# Patient Record
Sex: Female | Born: 1950 | Race: Black or African American | Hispanic: No | Marital: Single | Smoking: Never smoker
Health system: Southern US, Community
[De-identification: ages and names within clinical notes are randomized; demographics above are authoritative.]

## PROBLEM LIST (undated history)

## (undated) DIAGNOSIS — I10 Essential (primary) hypertension: Secondary | ICD-10-CM

## (undated) DIAGNOSIS — F329 Major depressive disorder, single episode, unspecified: Secondary | ICD-10-CM

## (undated) DIAGNOSIS — F32A Depression, unspecified: Secondary | ICD-10-CM

## (undated) HISTORY — PX: BACK SURGERY: SHX140

## (undated) HISTORY — PX: ABDOMINAL HYSTERECTOMY: SHX81

## (undated) HISTORY — PX: KNEE SURGERY: SHX244

---

## 2015-05-31 ENCOUNTER — Encounter (HOSPITAL_COMMUNITY): Payer: Self-pay | Admitting: Emergency Medicine

## 2015-05-31 ENCOUNTER — Emergency Department (HOSPITAL_COMMUNITY): Payer: 59

## 2015-05-31 ENCOUNTER — Emergency Department (HOSPITAL_COMMUNITY)
Admission: EM | Admit: 2015-05-31 | Discharge: 2015-06-01 | Disposition: A | Discharge status: Home / Self Care | Payer: 59 | Attending: Emergency Medicine | Admitting: Emergency Medicine

## 2015-05-31 DIAGNOSIS — S066X0A Traumatic subarachnoid hemorrhage without loss of consciousness, initial encounter: Secondary | ICD-10-CM | POA: Insufficient documentation

## 2015-05-31 DIAGNOSIS — I10 Essential (primary) hypertension: Secondary | ICD-10-CM | POA: Diagnosis not present

## 2015-05-31 DIAGNOSIS — S069X0A Unspecified intracranial injury without loss of consciousness, initial encounter: Secondary | ICD-10-CM

## 2015-05-31 DIAGNOSIS — I609 Nontraumatic subarachnoid hemorrhage, unspecified: Secondary | ICD-10-CM

## 2015-05-31 DIAGNOSIS — S0003XA Contusion of scalp, initial encounter: Secondary | ICD-10-CM | POA: Insufficient documentation

## 2015-05-31 DIAGNOSIS — W01198A Fall on same level from slipping, tripping and stumbling with subsequent striking against other object, initial encounter: Secondary | ICD-10-CM | POA: Diagnosis not present

## 2015-05-31 DIAGNOSIS — Y9289 Other specified places as the place of occurrence of the external cause: Secondary | ICD-10-CM | POA: Diagnosis not present

## 2015-05-31 DIAGNOSIS — Y9389 Activity, other specified: Secondary | ICD-10-CM | POA: Diagnosis not present

## 2015-05-31 DIAGNOSIS — S0990XA Unspecified injury of head, initial encounter: Secondary | ICD-10-CM | POA: Diagnosis present

## 2015-05-31 DIAGNOSIS — Y998 Other external cause status: Secondary | ICD-10-CM | POA: Diagnosis not present

## 2015-05-31 DIAGNOSIS — Z8659 Personal history of other mental and behavioral disorders: Secondary | ICD-10-CM | POA: Insufficient documentation

## 2015-05-31 HISTORY — DX: Major depressive disorder, single episode, unspecified: F32.9

## 2015-05-31 HISTORY — DX: Essential (primary) hypertension: I10

## 2015-05-31 HISTORY — DX: Depression, unspecified: F32.A

## 2015-05-31 MED ORDER — HYDROCODONE-ACETAMINOPHEN 5-325 MG PO TABS: 2.0000 | ORAL_TABLET | ORAL | Status: AC | PRN

## 2015-05-31 MED ORDER — FENTANYL CITRATE (PF) 100 MCG/2ML IJ SOLN: 50.0000 ug | INTRAMUSCULAR | Status: DC | PRN

## 2015-05-31 MED ORDER — ACETAMINOPHEN 325 MG PO TABS
650.0000 mg | ORAL_TABLET | Freq: Once | ORAL | Status: AC
  Administered 2015-05-31: 650 mg via ORAL
  Filled 2015-05-31: qty 2

## 2015-05-31 NOTE — ED Provider Notes (Signed)
CSN: [id number]     Arrival date & time 05/31/15  2031 History   First MD Initiated Contact with Patient 05/31/15 2152     Chief Complaint  Patient presents with  . Fall  . Head Injury     (Consider location/radiation/quality/duration/timing/severity/associated sxs/prior Treatment) HPI  The patient is a 64 year old female, she reports a history of hypertension and depression, according to family members the patient was rolling his evening, she let her foot slip over the line at which time she slipped on the slick surface, falling onto her bottom and then backwards onto the back of her scalp. She did strike her head hard, there was no loss of consciousness, the patient does not remember this incident, she does not how she got to the hospital. She denies being on blood thinners, the family corroborates this. She is from out of town, home [place]. The patient denies any nausea or seizures  Past Medical History  Diagnosis Date  . Hypertension   . Depression    Past Surgical History  Procedure Laterality Date  . Knee surgery Right   . Abdominal hysterectomy     No family history on file. Social History  Substance Use Topics  . Smoking status: Never Smoker   . Smokeless tobacco: None  . Alcohol Use: Yes     Comment: occasionally   OB History    No data available     Review of Systems  All other systems reviewed and are negative.     Allergies  Review of patient's allergies indicates no known allergies.  Home Medications   Prior to Admission medications   Medication Sig Start Date End Date Taking? Authorizing Provider  HYDROcodone-acetaminophen (NORCO/VICODIN) 5-325 MG tablet Take 2 tablets by mouth every 4 (four) hours as needed. 05/31/15   Eber HongBrian Kyliee Ortego, MD   BP 140/90 mmHg  Pulse 74  Temp(Src) 98.9 F (37.2 C) (Oral)  Resp 29  Ht 5\' 4"  (1.626 m)  Wt 265 lb (120.203 kg)  BMI 45.46 kg/m2  SpO2 100% Physical Exam  Constitutional: She appears well-developed  and well-nourished. No distress.  HENT:  Head: Normocephalic.  Mouth/Throat: Oropharynx is clear and moist. No oropharyngeal exudate.  Hematoma to posterior scalp  Eyes: Conjunctivae and EOM are normal. Pupils are equal, round, and reactive to light. Right eye exhibits no discharge. Left eye exhibits no discharge. No scleral icterus.  Neck: Normal range of motion. Neck supple. No JVD present. No thyromegaly present.  Cardiovascular: Normal rate, regular rhythm, normal heart sounds and intact distal pulses.  Exam reveals no gallop and no friction rub.   No murmur heard. Pulmonary/Chest: Effort normal and breath sounds normal. No respiratory distress. She has no wheezes. She has no rales.  Abdominal: Soft. Bowel sounds are normal. She exhibits no distension and no mass. There is no tenderness.  Musculoskeletal: Normal range of motion. She exhibits no edema or tenderness.  Supple joints, soft compartments diffusely  Lymphadenopathy:    She has no cervical adenopathy.  Neurological: She is alert. Coordination normal.  Normal strength and sensation in all 4 extremities, normal speech, normal coordination by finger-nose-finger, memory loss of the events of the evening  Skin: Skin is warm and dry. No rash noted. No erythema.  Psychiatric: She has a normal mood and affect. Her behavior is normal.  Nursing note and vitals reviewed.   ED Course  Procedures (including critical care time) Labs Review Labs Reviewed - No data to display  Imaging Review Ct Head  Wo Contrast  05/31/2015  CLINICAL DATA:  64 year old female with hypertension presenting with fall and trauma to the head. EXAM: CT HEAD WITHOUT CONTRAST TECHNIQUE: Contiguous axial images were obtained from the base of the skull through the vertex without intravenous contrast. COMPARISON:  None. FINDINGS: There is a small curvilinear high attenuation along the left posterior frontal convexity most compatible with subarachnoid hemorrhage. No  other intracranial hemorrhage identified. There is no mass effect or midline shift. The ventricles and the sulci are appropriate in size for the patient's age. The gray-white matter differentiation is preserved. There is partial opacification of the visualized maxillary sinuses as well as partial opacification of the sphenoid sinuses. The visualized mastoid air cells are clear. The calvarium is intact. Small scalp hematoma noted over the posterior vertex. IMPRESSION: Small left posterior frontal convexity subarachnoid hemorrhage. Follow-up recommended. Critical Value/emergent results were called by telephone at the time of interpretation on 05/31/2015 at 9:49 pm to Dr. Rhunette Croft , who verbally acknowledged these results. Electronically Signed   By: Elgie Collard M.D.   On: 05/31/2015 21:53   Ct Cervical Spine Wo Contrast  05/31/2015  CLINICAL DATA:  [place] EXAM: CT CERVICAL SPINE WITHOUT CONTRAST TECHNIQUE: Multidetector CT imaging of the cervical spine was performed without intravenous contrast. Multiplanar CT image reconstructions were also generated. COMPARISON:  None. FINDINGS: The alignment of the cervical spine appears normal. The facet joints appear well-aligned. The vertebral body heights and disc spaces are well preserved. No fracture or subluxation identified. Mild multi level disc space narrowing and ventral endplate spurring noted. IMPRESSION: 1. No evidence for cervical spine fracture. 2. Mild degenerative disc disease. Electronically Signed   By: Signa Kell M.D.   On: 05/31/2015 23:34   I have personally reviewed and evaluated these images and lab results as part of my medical decision-making.    MDM   Final diagnoses:  SAH (subarachnoid hemorrhage) (HCC)  TBI (traumatic brain injury), without loss of consciousness, initial encounter (HCC)    The patient does have some posterior C-spine tenderness, she was placed in cervical immobilization, go back for CT C-spine.  Subarachnoid hemorrhage is seen on the CT scan of the brain without contrast. Neurosurgery was paged, this does look like a small amount of traumatic subarachnoid hemorrhage.   D/w Dr. Bevely Palmer who states pt can go home with family - I agree, she is well appearing.    Pt informed of results - well appearing, no CT C spine findings of concern.  Discussed precautions with the patient and family members at length including avoiding anti-inflammatories and worsening neurologic function for which she should return immediately, understanding expressed.  Given copy of results / tylenol and note for airlines.  Meds given in ED:  Medications  fentaNYL (SUBLIMAZE) injection 50 mcg (not administered)    New Prescriptions   HYDROCODONE-ACETAMINOPHEN (NORCO/VICODIN) 5-325 MG TABLET    Take 2 tablets by mouth every 4 (four) hours as needed.      Eber Hong, MD 05/31/15 [phone]

## 2015-05-31 NOTE — Discharge Instructions (Signed)
Avoid NSAID medicines like aleve, aspirin or ibuprofen  Tylenol for pain  Hydrocodone for Severe pain  Call your doctor in the morning to set up a follow up appointment for 1 week from today

## 2015-05-31 NOTE — ED Notes (Signed)
[place] No loc according to family but pt has had trouble remembering incident. She is not on blood thinners. Pt is asking repetitive questions.

## 2015-05-31 NOTE — ED Notes (Addendum)
Pt placed in C-collar per Dr. Hyacinth MeekerMiller order, placed in gown, pulse ox, bp cuff and 5 lead. Family bedside.

## 2016-07-26 IMAGING — CT CT CERVICAL SPINE W/O CM
3 series · 13 of 33 positions shown, 16 images · non-contrast
Comparison: None.

CLINICAL DATA: Fell at [employer].

EXAM:
CT CERVICAL SPINE WITHOUT CONTRAST
TECHNIQUE: Multidetector CT imaging of the cervical spine was performed without
intravenous contrast. Multiplanar CT image reconstructions were also
generated.

[Series 205: coronals · coronal · 0.39mm/px · 3 of 38 slices shown]
[im 8/38  bone]
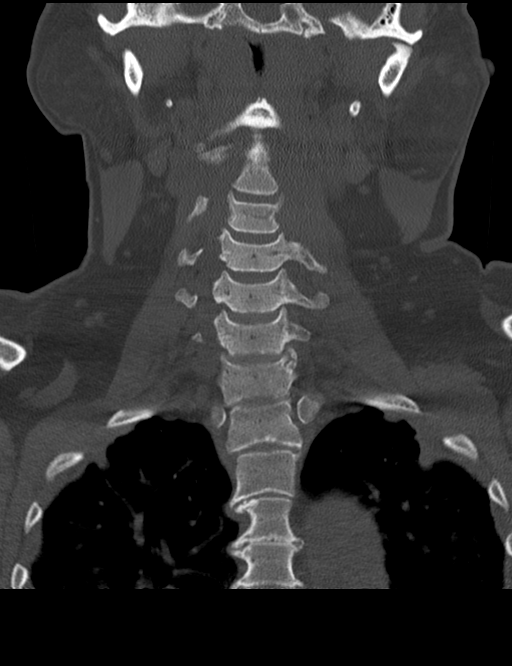
[im 15/38  bone]
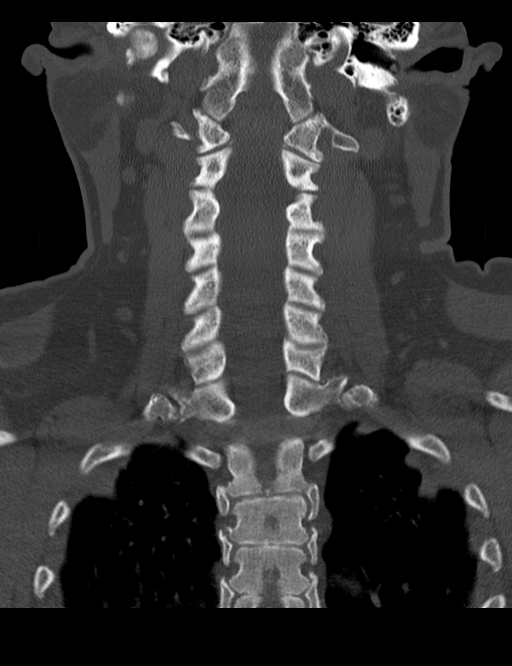
[im 23/38  bone]
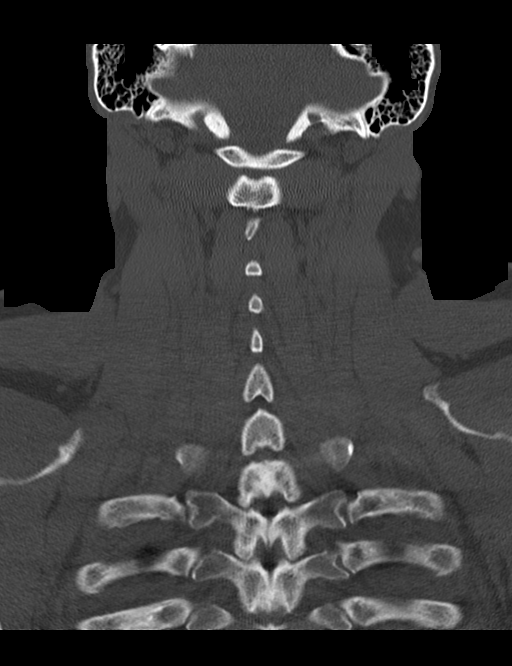

[Series 206: sagittals · sagittal · 0.39mm/px · 5 of 47 slices shown, 6 images]
[im 16/47  bone]
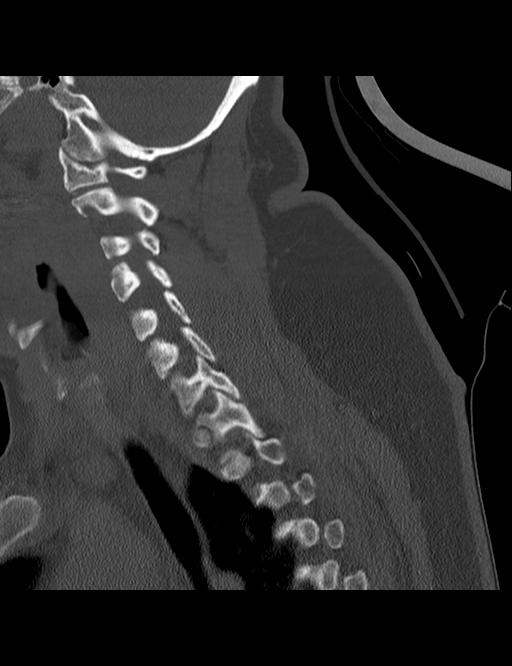
[im 20/47  bone]
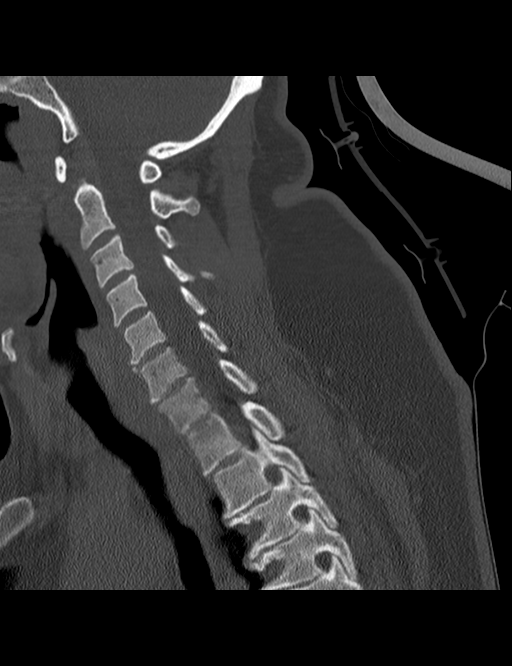
[im 24/47  soft-tissue]
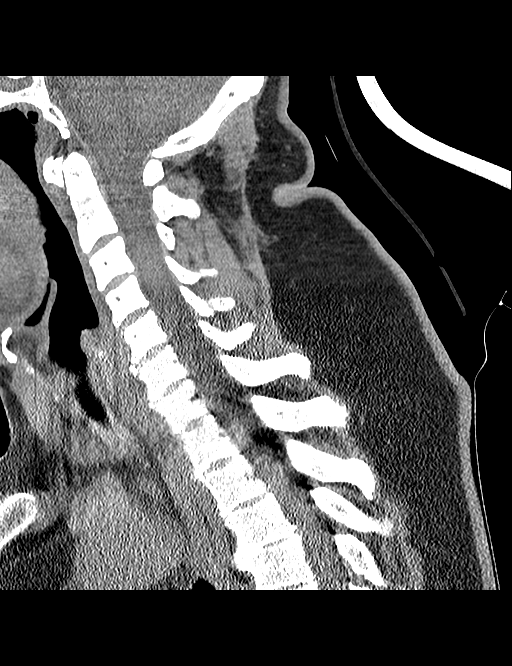
[im 24/47  bone]
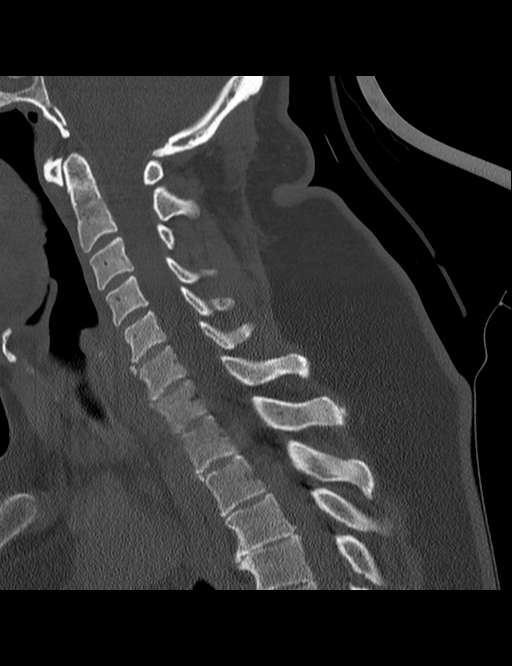
[im 27/47  bone]
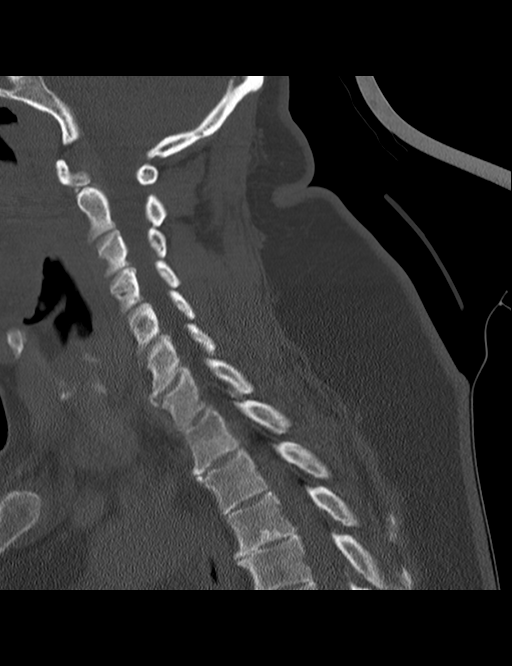
[im 31/47  bone]
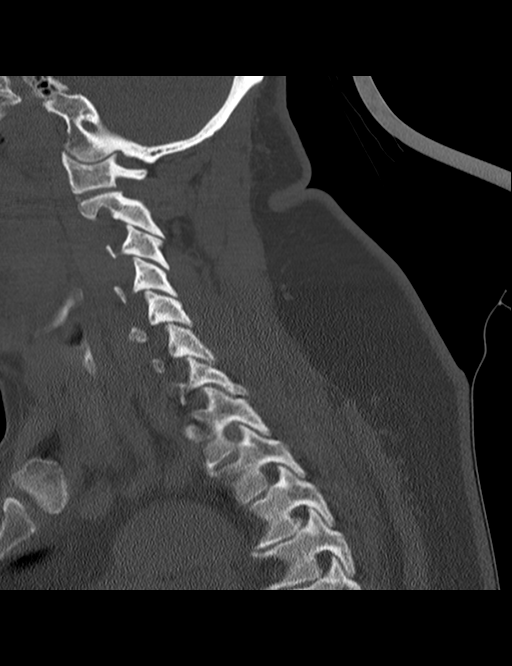

[Series 208: orthogonals · axial · 0.39mm/px · z∈[+9,+124]mm · 5 of 96 slices shown, 7 images]
[im 15/96  soft-tissue]
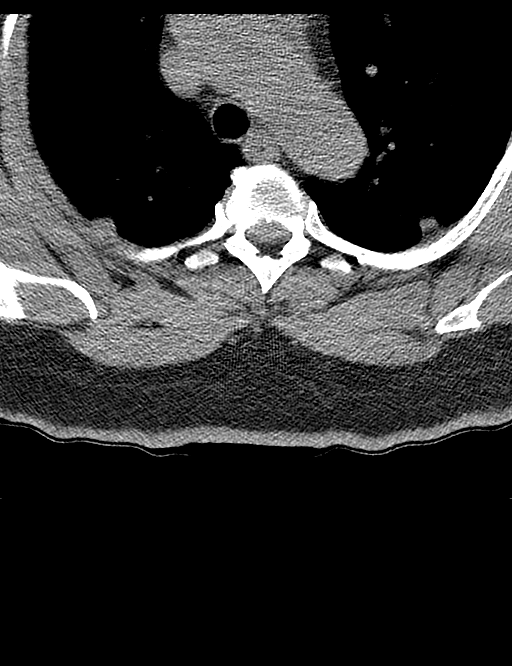
[im 15/96  bone]
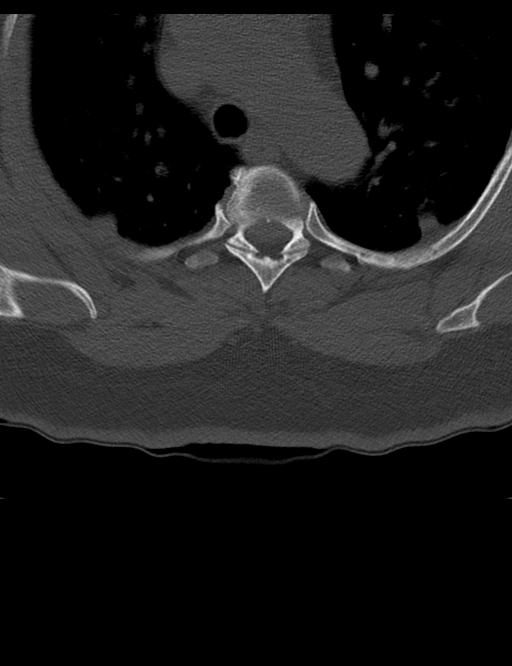
[im 30/96  bone]
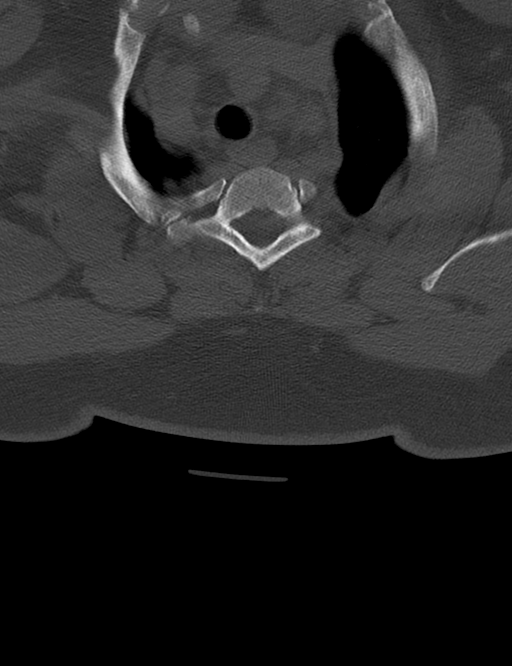
[im 52/96  bone]
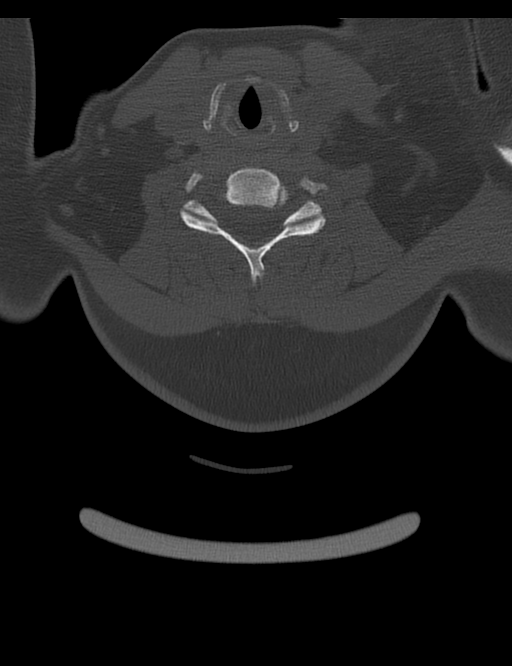
[im 66/96  bone]
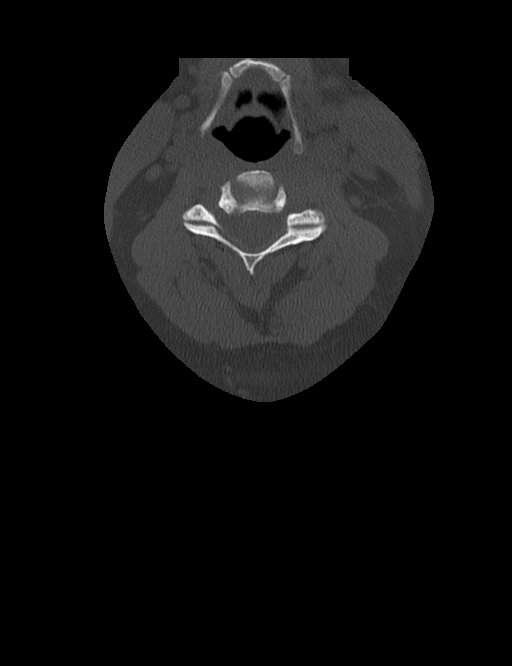
[im 81/96  soft-tissue]
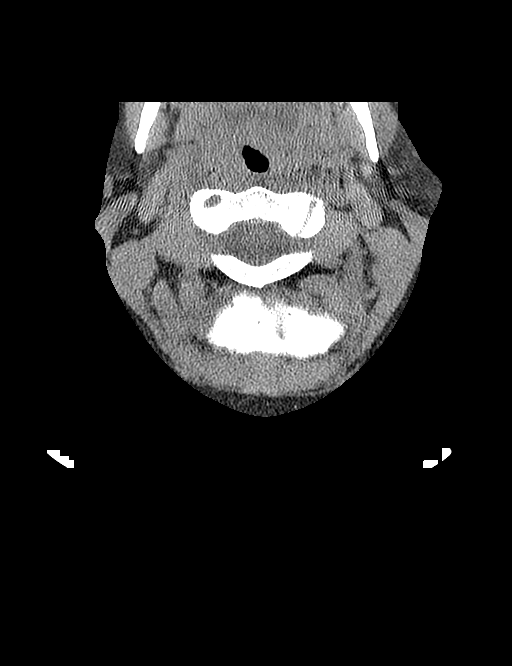
[im 81/96  bone]
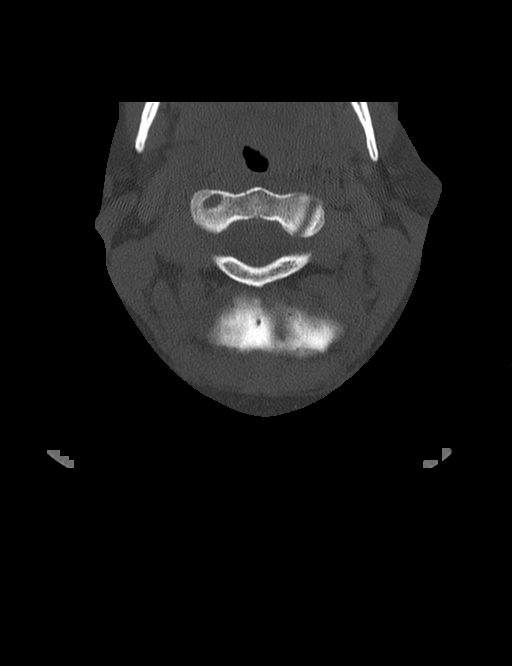

[13 of 33 positions shown; findings below may reference images not displayed]

FINDINGS: The alignment of the cervical spine appears normal. The facet joints
appear well-aligned. The vertebral body heights and disc spaces are
well preserved. No fracture or subluxation identified. Mild multi
level disc space narrowing and ventral endplate spurring noted.
IMPRESSION: 1. No evidence for cervical spine fracture.
2. Mild degenerative disc disease.

## 2019-06-02 ENCOUNTER — Emergency Department (HOSPITAL_COMMUNITY)
Admission: EM | Admit: 2019-06-02 | Discharge: 2019-06-02 | Discharge status: Against Medical Advice | Payer: Medicare Other | Attending: Emergency Medicine | Admitting: Emergency Medicine

## 2019-06-02 ENCOUNTER — Encounter (HOSPITAL_COMMUNITY): Payer: Self-pay | Admitting: *Deleted

## 2019-06-02 DIAGNOSIS — Z5321 Procedure and treatment not carried out due to patient leaving prior to being seen by health care provider: Secondary | ICD-10-CM | POA: Insufficient documentation

## 2019-06-02 DIAGNOSIS — M5430 Sciatica, unspecified side: Secondary | ICD-10-CM | POA: Diagnosis present

## 2019-06-02 LAB — URINALYSIS, ROUTINE W REFLEX MICROSCOPIC
Bacteria, UA: NONE SEEN
Bilirubin Urine: NEGATIVE
Glucose, UA: NEGATIVE mg/dL
Hgb urine dipstick: NEGATIVE
Ketones, ur: NEGATIVE mg/dL
Nitrite: NEGATIVE
Protein, ur: 30 mg/dL — AB
Specific Gravity, Urine: 1.03 (ref 1.005–1.030)
pH: 5 (ref 5.0–8.0)

## 2019-06-02 NOTE — ED Notes (Signed)
Pt daughter came to pick the patient up, wants to take the patient to another facility to be seen. Encouraged to return if she wishes and follow up for symptoms.

## 2019-06-02 NOTE — ED Triage Notes (Addendum)
Pt with hx of sciatica is here for increasing sciatica pain which pt states is so severe that it has been difficult to get up and she has had to crawl to the bathroom.  Pt spoke with her MD who wanted her to be evaluated for this and especially due to increasing difficulties controlling her bladder and has been incontinent.  Pt states that she tries to get to the bathroom and the urine comes before she can get her pants down and ready as she is in such sever pain.

## 2024-05-15 ENCOUNTER — Other Ambulatory Visit: Payer: Self-pay

## 2024-05-15 ENCOUNTER — Encounter (HOSPITAL_BASED_OUTPATIENT_CLINIC_OR_DEPARTMENT_OTHER): Payer: Self-pay | Admitting: Emergency Medicine

## 2024-05-15 ENCOUNTER — Emergency Department (HOSPITAL_BASED_OUTPATIENT_CLINIC_OR_DEPARTMENT_OTHER)

## 2024-05-15 ENCOUNTER — Emergency Department (HOSPITAL_BASED_OUTPATIENT_CLINIC_OR_DEPARTMENT_OTHER)
Admission: EM | Admit: 2024-05-15 | Discharge: 2024-05-15 | Disposition: A | Discharge status: Home / Self Care | Attending: Emergency Medicine | Admitting: Emergency Medicine

## 2024-05-15 DIAGNOSIS — R600 Localized edema: Secondary | ICD-10-CM | POA: Diagnosis not present

## 2024-05-15 DIAGNOSIS — R002 Palpitations: Secondary | ICD-10-CM | POA: Diagnosis present

## 2024-05-15 DIAGNOSIS — Z7901 Long term (current) use of anticoagulants: Secondary | ICD-10-CM | POA: Insufficient documentation

## 2024-05-15 DIAGNOSIS — I482 Chronic atrial fibrillation, unspecified: Secondary | ICD-10-CM | POA: Diagnosis not present

## 2024-05-15 DIAGNOSIS — I4892 Unspecified atrial flutter: Secondary | ICD-10-CM | POA: Insufficient documentation

## 2024-05-15 DIAGNOSIS — R7989 Other specified abnormal findings of blood chemistry: Secondary | ICD-10-CM | POA: Insufficient documentation

## 2024-05-15 DIAGNOSIS — I10 Essential (primary) hypertension: Secondary | ICD-10-CM | POA: Insufficient documentation

## 2024-05-15 DIAGNOSIS — E119 Type 2 diabetes mellitus without complications: Secondary | ICD-10-CM | POA: Insufficient documentation

## 2024-05-15 LAB — BASIC METABOLIC PANEL WITH GFR
Anion gap: 13 (ref 5–15)
BUN: 16 mg/dL (ref 8–23)
CO2: 22 mmol/L (ref 22–32)
Calcium: 9.6 mg/dL (ref 8.9–10.3)
Chloride: 105 mmol/L (ref 98–111)
Creatinine, Ser: 1.11 mg/dL — ABNORMAL HIGH (ref 0.44–1.00)
GFR, Estimated: 53 mL/min — ABNORMAL LOW (ref >60)
Glucose, Bld: 124 mg/dL — ABNORMAL HIGH (ref 70–99)
Potassium: 4.5 mmol/L (ref 3.5–5.1)
Sodium: 140 mmol/L (ref 135–145)

## 2024-05-15 LAB — TROPONIN T, HIGH SENSITIVITY
Troponin T High Sensitivity: 39 ng/L — ABNORMAL HIGH (ref 0–19)
Troponin T High Sensitivity: 36 ng/L — ABNORMAL HIGH (ref 0–19)

## 2024-05-15 LAB — CBC
HCT: 46.3 % — ABNORMAL HIGH (ref 36.0–46.0)
Hemoglobin: 14.6 g/dL (ref 12.0–15.0)
MCH: 26.4 pg (ref 26.0–34.0)
MCHC: 31.5 g/dL (ref 30.0–36.0)
MCV: 83.9 fL (ref 80.0–100.0)
Platelets: 268 K/uL (ref 150–400)
RBC: 5.52 MIL/uL — ABNORMAL HIGH (ref 3.87–5.11)
RDW: 14.4 % (ref 11.5–15.5)
WBC: 5.6 K/uL (ref 4.0–10.5)
nRBC: 0 % (ref 0.0–0.2)

## 2024-05-15 LAB — MAGNESIUM: Magnesium: 2 mg/dL (ref 1.7–2.4)

## 2024-05-15 MED ORDER — AMIODARONE HCL 200 MG PO TABS
200.0000 mg | ORAL_TABLET | Freq: Every day | ORAL | Status: DC
  Administered 2024-05-15: 200 mg via ORAL
  Filled 2024-05-15: qty 1

## 2024-05-15 MED ORDER — METOPROLOL TARTRATE 5 MG/5ML IV SOLN
5.0000 mg | Freq: Once | INTRAVENOUS | Status: AC
  Administered 2024-05-15: 5 mg via INTRAVENOUS
  Filled 2024-05-15: qty 5

## 2024-05-15 NOTE — ED Triage Notes (Signed)
 Palpitations since last night , Hx Afib . Also endorses left upper chest throbbing pain . Reports her HR was 111-128 .

## 2024-05-15 NOTE — ED Provider Notes (Signed)
 [facility] Provider Note   CSN: [id number] Arrival date & time: 05/15/24  1133     Patient presents with: Palpitations   Michelle Hart is a 73 y.o. female.   HPI    73 year old female with a history of type 2 diabetes, hypertension, depression, atrial fibrillation on eliquis who presents with concern for palpitations.   Last night when laying down to sleep could feel heart rate increasing, watch noted high heart rate, started around 111, then up to 147, gone back down and hanging around 118, feels it in chest palpitations, fatigue, at times also pain,pressure like pain, can feel it beating Tenderness to left neck Shortness of breath Feet swelling, flew from [facility], both swelling, right more No nausea, vomiting, diarrhea, black or bloody stools Urinating a lot, but also drinking a lot of water  No fever, cough, congestion  Since Nov 1 has been off of amiodarone and metoprolol because of bradycardia Eliquis 5mg  taking BID without missing dose    Sees Cardiology in [facility] here Is on ozempic but did not take it yesterday  Hx of ablation and cardioversion Was admitted in the hospital because of elevated troponins No known chf hx    Past Medical History:  Diagnosis Date   A-fib (HCC)    Depression    Diabetes mellitus without complication (HCC)    Hypertension     Prior to Admission medications  Medication Sig Start Date End Date Taking? Authorizing Provider  HYDROcodone -acetaminophen  (NORCO/VICODIN) 5-325 MG tablet Take 2 tablets by mouth every 4 (four) hours as needed. 05/31/15   Cleotilde Rogue, MD    Allergies: Patient has no known allergies.    Review of Systems  Updated Vital Signs BP 133/89 (BP Location: Left Arm)   Pulse (!) 115   Temp 98.7 F (37.1 C) (Oral)   Resp 20   Ht 5' 4 (1.626 m)   Wt 95.7 kg   SpO2 95%   BMI 36.21 kg/m   Physical Exam Vitals and nursing note reviewed.   Constitutional:      General: She is not in acute distress.    Appearance: She is well-developed. She is not diaphoretic.  HENT:     Head: Normocephalic and atraumatic.  Eyes:     Conjunctiva/sclera: Conjunctivae normal.  Cardiovascular:     Rate and Rhythm: Regular rhythm. Tachycardia present.     Heart sounds: Normal heart sounds. No murmur heard.    No friction rub. No gallop.  Pulmonary:     Effort: Pulmonary effort is normal. No respiratory distress.     Breath sounds: Normal breath sounds. No wheezing or rales.  Abdominal:     General: There is no distension.     Palpations: Abdomen is soft.     Tenderness: There is no abdominal tenderness. There is no guarding.  Musculoskeletal:        General: No tenderness.     Cervical back: Normal range of motion.     Right lower leg: Edema present.     Left lower leg: Edema (LLE appears to be worse) present.  Skin:    General: Skin is warm and dry.     Findings: No erythema or rash.  Neurological:     Mental Status: She is alert and oriented to person, place, and time.     (all labs ordered are listed, but only abnormal results are displayed) Labs Reviewed  BASIC METABOLIC PANEL WITH GFR - Abnormal;  Notable for the following components:      Result Value   Glucose, Bld 124 (*)    Creatinine, Ser 1.11 (*)    GFR, Estimated 53 (*)    All other components within normal limits  CBC - Abnormal; Notable for the following components:   RBC 5.52 (*)    HCT 46.3 (*)    All other components within normal limits  TROPONIN T, HIGH SENSITIVITY - Abnormal; Notable for the following components:   Troponin T High Sensitivity 39 (*)    All other components within normal limits  TROPONIN T, HIGH SENSITIVITY - Abnormal; Notable for the following components:   Troponin T High Sensitivity 36 (*)    All other components within normal limits  MAGNESIUM    EKG: EKG Interpretation Date/Time:  Thursday May 15 2024 13:03:53  EST Ventricular Rate:  119 PR Interval:  124 QRS Duration:  101 QT Interval:  303 QTC Calculation: 427 R Axis:   -39  Text Interpretation: Atrial flutter Abnormal R-wave progression, early transition LVH with secondary repolarization abnormality Anterior Q waves, possibly due to LVH Confirmed by Dreama Longs ([id number]) on 05/15/2024 1:26:02 PM  Radiology: US  Venous Img Lower Bilateral (DVT) Result Date: 05/15/2024 CLINICAL DATA:  73 year old female with bilateral lower extremity swelling after recent travel. EXAM: BILATERAL LOWER EXTREMITY VENOUS DOPPLER ULTRASOUND TECHNIQUE: Gray-scale sonography with graded compression, as well as color Doppler and duplex ultrasound were performed to evaluate the lower extremity deep venous systems from the level of the common femoral vein and including the common femoral, femoral, profunda femoral, popliteal and calf veins including the posterior tibial, peroneal and gastrocnemius veins when visible. The superficial great saphenous vein was also interrogated. Spectral Doppler was utilized to evaluate flow at rest and with distal augmentation maneuvers in the common femoral, femoral and popliteal veins. COMPARISON:  None Available. FINDINGS: RIGHT LOWER EXTREMITY Common Femoral Vein: No evidence of thrombus. Normal compressibility, respiratory phasicity and response to augmentation. Saphenofemoral Junction: No evidence of thrombus. Normal compressibility and flow on color Doppler imaging. Profunda Femoral Vein: No evidence of thrombus. Normal compressibility and flow on color Doppler imaging. Femoral Vein: No evidence of thrombus. Normal compressibility, respiratory phasicity and response to augmentation. Popliteal Vein: No evidence of thrombus. Normal compressibility, respiratory phasicity and response to augmentation. Calf Veins: No evidence of thrombus. Normal compressibility and flow on color Doppler imaging. Other Findings: Ovoid, mildly complex fluid collection  in the right popliteal fossa measuring approximately 4.9 x 1.2 x 4.1 cm. LEFT LOWER EXTREMITY Common Femoral Vein: No evidence of thrombus. Normal compressibility, respiratory phasicity and response to augmentation. Saphenofemoral Junction: No evidence of thrombus. Normal compressibility and flow on color Doppler imaging. Profunda Femoral Vein: No evidence of thrombus. Normal compressibility and flow on color Doppler imaging. Femoral Vein: No evidence of thrombus. Normal compressibility, respiratory phasicity and response to augmentation. Popliteal Vein: No evidence of thrombus. Normal compressibility, respiratory phasicity and response to augmentation. Calf Veins: No evidence of thrombus. Normal compressibility and flow on color Doppler imaging. Other Findings:  None. IMPRESSION: 1. No evidence of bilateral lower extremity deep venous thrombosis. 2. Mildly complex right Baker cyst measuring up to 4.9 cm. Ester Sides, MD [facility] Radiology Electronically Signed   By: Ester Sides M.D.   On: 05/15/2024 14:13   DG Chest 2 View Result Date: 05/15/2024 CLINICAL DATA:  Chest pain and palpitations since last night. EXAM: CHEST - 2 VIEW COMPARISON:  None Available. FINDINGS: Lungs are  adequately inflated and otherwise clear. Cardiomediastinal silhouette is normal. Minimal degenerative change of the spine. IMPRESSION: No active cardiopulmonary disease. Electronically Signed   By: Toribio Agreste M.D.   On: 05/15/2024 12:37     Procedures   Medications Ordered in the ED  metoprolol tartrate (LOPRESSOR) injection 5 mg (5 mg Intravenous Given 05/15/24 1348)                                    Medical Decision Making Amount and/or Complexity of Data Reviewed Labs: ordered. Radiology: ordered.  Risk Prescription drug management.   73 year old female with a history of type 2 diabetes, hypertension, depression, atrial fibrillation on eliquis who presents  with concern for palpitations.   EKG was personally by and interpreted by me and does appear to show P waves and looks to be an ectopic atrial rhythm vs flutter.  Discussed with Cardiology, Dr. Burton who reports it is flutter. Because she has been adherent to her eliquis, he recommends ED cardioversion, restarting metoprolol.   Labs completed and personally evaluated interpreted by me show creatinine of 1.1, no clinically significant electrolyte abnormalities, no anemia or leukocytosis.  Magnesium within normal limits.  Her troponin is mildly elevated to 39, likely in the setting of elevated heart rate, repeat is 36. Not consistent with ACS.  Ordered DVT study as patient reporting asymmetric swelling of legs after flight from [facility].   DVT study negative.  Given cardiology feels this is flutter, no DVT< on eliquis, overall suspicion for PE is low.  Discussed options for care including ED cardioversion, rate control medications.  She does not want to have another ED cardioversion, and prefers medical management and avoiding admission. Called her cardiologist, Dr. Joeline who sees her in [place].  Notes she previously was in persistent afib for 4 months with rate 100-110s. Recommends restarting amiodarone 200mg  BID.  HR 114 prior to discharge, not having significant symptoms.  Discussed if HR persistently above 120 or she is having symptoms of dyspnea, chest pain, she should return to ED.  Will restart her amiodarone, recommend taking it and eliquis regularly.  Patient discharged in stable condition with understanding of reasons to return.        Final diagnoses:  Atrial flutter, unspecified type [facility])    ED Discharge Orders     None          Dreama Longs, MD 05/15/24 2210

## 2024-05-15 NOTE — ED Notes (Signed)
 Report received from [place], [place]. Assuming pt care at this time.

## 2024-05-15 NOTE — Discharge Instructions (Addendum)
 START taking your amiodarone again--200mg  TWICE A DAY. N Continue your eliquis.
